# Patient Record
Sex: Female | Born: 1975
Health system: Southern US, Community
[De-identification: ages and names within clinical notes are randomized; demographics above are authoritative.]

## PROBLEM LIST (undated history)

## (undated) DIAGNOSIS — J45909 Unspecified asthma, uncomplicated: Secondary | ICD-10-CM

## (undated) DIAGNOSIS — I10 Essential (primary) hypertension: Secondary | ICD-10-CM

## (undated) HISTORY — PX: ABDOMINAL HYSTERECTOMY: SHX81

---

## 2005-10-04 ENCOUNTER — Emergency Department: Payer: Self-pay | Admitting: Emergency Medicine

## 2005-10-14 ENCOUNTER — Emergency Department: Payer: Self-pay | Admitting: General Practice

## 2006-06-30 ENCOUNTER — Emergency Department: Payer: Self-pay | Admitting: Emergency Medicine

## 2006-08-02 ENCOUNTER — Emergency Department: Payer: Self-pay | Admitting: Internal Medicine

## 2006-09-26 ENCOUNTER — Emergency Department: Payer: Self-pay | Admitting: Emergency Medicine

## 2007-01-02 ENCOUNTER — Ambulatory Visit: Payer: Self-pay | Admitting: Obstetrics & Gynecology

## 2007-01-03 ENCOUNTER — Ambulatory Visit: Payer: Self-pay | Admitting: Obstetrics & Gynecology

## 2007-06-15 ENCOUNTER — Emergency Department: Payer: Self-pay | Admitting: Internal Medicine

## 2007-12-17 ENCOUNTER — Emergency Department: Payer: Self-pay | Admitting: Emergency Medicine

## 2007-12-25 ENCOUNTER — Emergency Department: Payer: Self-pay | Admitting: Emergency Medicine

## 2008-01-23 ENCOUNTER — Emergency Department: Payer: Self-pay | Admitting: Emergency Medicine

## 2008-01-26 ENCOUNTER — Emergency Department: Payer: Self-pay | Admitting: Emergency Medicine

## 2008-01-27 ENCOUNTER — Emergency Department (HOSPITAL_COMMUNITY): Admission: EM | Admit: 2008-01-27 | Discharge: 2008-01-27 | Payer: Self-pay | Admitting: Emergency Medicine

## 2008-06-21 ENCOUNTER — Emergency Department: Payer: Self-pay | Admitting: Emergency Medicine

## 2008-06-22 ENCOUNTER — Emergency Department: Payer: Self-pay | Admitting: Emergency Medicine

## 2008-10-17 ENCOUNTER — Emergency Department: Payer: Self-pay | Admitting: Emergency Medicine

## 2009-03-06 ENCOUNTER — Ambulatory Visit: Payer: Self-pay | Admitting: Internal Medicine

## 2009-03-11 ENCOUNTER — Ambulatory Visit: Payer: Self-pay | Admitting: Internal Medicine

## 2011-02-19 ENCOUNTER — Emergency Department: Payer: Self-pay | Admitting: Internal Medicine

## 2011-02-27 ENCOUNTER — Emergency Department: Payer: Self-pay | Admitting: Emergency Medicine

## 2011-11-07 ENCOUNTER — Emergency Department: Payer: Self-pay | Admitting: Emergency Medicine

## 2012-07-20 ENCOUNTER — Ambulatory Visit: Payer: Self-pay | Admitting: Urology

## 2012-09-03 ENCOUNTER — Emergency Department: Payer: Self-pay | Admitting: Emergency Medicine

## 2012-09-03 LAB — COMPREHENSIVE METABOLIC PANEL
Albumin: 3.4 g/dL (ref 3.4–5.0)
Alkaline Phosphatase: 92 U/L (ref 50–136)
Anion Gap: 6 — ABNORMAL LOW (ref 7–16)
BUN: 11 mg/dL (ref 7–18)
Calcium, Total: 8.4 mg/dL — ABNORMAL LOW (ref 8.5–10.1)
Co2: 23 mmol/L (ref 21–32)
Glucose: 74 mg/dL (ref 65–99)
Potassium: 3.6 mmol/L (ref 3.5–5.1)
SGOT(AST): 25 U/L (ref 15–37)
SGPT (ALT): 19 U/L (ref 12–78)
Total Protein: 7.8 g/dL (ref 6.4–8.2)

## 2012-09-03 LAB — APTT: Activated PTT: 29.5 secs (ref 23.6–35.9)

## 2012-09-03 LAB — CBC
HCT: 41.3 % (ref 35.0–47.0)
HGB: 13.8 g/dL (ref 12.0–16.0)
RBC: 4.67 10*6/uL (ref 3.80–5.20)
WBC: 4.7 10*3/uL (ref 3.6–11.0)

## 2012-09-03 LAB — PROTIME-INR
INR: 0.9
Prothrombin Time: 12 secs (ref 11.5–14.7)

## 2012-09-03 LAB — TROPONIN I: Troponin-I: <0.02 ng/mL

## 2012-12-25 ENCOUNTER — Emergency Department: Payer: Self-pay | Admitting: Emergency Medicine

## 2012-12-25 LAB — URINALYSIS, COMPLETE
Bacteria: NONE SEEN
Bilirubin,UR: NEGATIVE
Glucose,UR: NEGATIVE mg/dL (ref 0–75)
Leukocyte Esterase: NEGATIVE
Nitrite: NEGATIVE
Ph: 6 (ref 4.5–8.0)
Protein: NEGATIVE
RBC,UR: 7 /HPF (ref 0–5)
Squamous Epithelial: 3

## 2012-12-25 LAB — WET PREP, GENITAL

## 2013-03-20 ENCOUNTER — Emergency Department: Payer: Self-pay | Admitting: Emergency Medicine

## 2013-03-20 LAB — COMPREHENSIVE METABOLIC PANEL
Alkaline Phosphatase: 96 U/L (ref 50–136)
Anion Gap: 6 — ABNORMAL LOW (ref 7–16)
BUN: 11 mg/dL (ref 7–18)
Chloride: 109 mmol/L — ABNORMAL HIGH (ref 98–107)
Co2: 26 mmol/L (ref 21–32)
Creatinine: 0.85 mg/dL (ref 0.60–1.30)
EGFR (Non-African Amer.): >60
Glucose: 82 mg/dL (ref 65–99)
Osmolality: 280 (ref 275–301)
Potassium: 3.5 mmol/L (ref 3.5–5.1)
SGOT(AST): 20 U/L (ref 15–37)
Sodium: 141 mmol/L (ref 136–145)

## 2013-03-20 LAB — TROPONIN I: Troponin-I: <0.02 ng/mL

## 2013-03-20 LAB — CBC
HGB: 13.2 g/dL (ref 12.0–16.0)
MCV: 87 fL (ref 80–100)
RBC: 4.58 10*6/uL (ref 3.80–5.20)
WBC: 4.5 10*3/uL (ref 3.6–11.0)

## 2013-12-17 ENCOUNTER — Ambulatory Visit: Payer: Self-pay | Admitting: Family Medicine

## 2013-12-18 ENCOUNTER — Emergency Department (HOSPITAL_COMMUNITY)
Admission: EM | Admit: 2013-12-18 | Discharge: 2013-12-19 | Discharge status: Against Medical Advice | Payer: No Typology Code available for payment source | Attending: Emergency Medicine | Admitting: Emergency Medicine

## 2013-12-18 ENCOUNTER — Encounter (HOSPITAL_COMMUNITY): Payer: Self-pay | Admitting: Emergency Medicine

## 2013-12-18 ENCOUNTER — Emergency Department: Payer: Self-pay | Admitting: Emergency Medicine

## 2013-12-18 DIAGNOSIS — R51 Headache: Secondary | ICD-10-CM | POA: Insufficient documentation

## 2013-12-18 DIAGNOSIS — J45909 Unspecified asthma, uncomplicated: Secondary | ICD-10-CM | POA: Insufficient documentation

## 2013-12-18 DIAGNOSIS — I10 Essential (primary) hypertension: Secondary | ICD-10-CM | POA: Insufficient documentation

## 2013-12-18 DIAGNOSIS — R209 Unspecified disturbances of skin sensation: Secondary | ICD-10-CM | POA: Insufficient documentation

## 2013-12-18 HISTORY — DX: Essential (primary) hypertension: I10

## 2013-12-18 HISTORY — DX: Unspecified asthma, uncomplicated: J45.909

## 2013-12-18 LAB — BASIC METABOLIC PANEL
Anion Gap: 6 — ABNORMAL LOW (ref 7–16)
BUN: 11 mg/dL (ref 7–18)
CHLORIDE: 102 mmol/L (ref 98–107)
Calcium, Total: 8.4 mg/dL — ABNORMAL LOW (ref 8.5–10.1)
Co2: 28 mmol/L (ref 21–32)
Creatinine: 0.99 mg/dL (ref 0.60–1.30)
EGFR (African American): >60
Glucose: 103 mg/dL — ABNORMAL HIGH (ref 65–99)
Osmolality: 272 (ref 275–301)
Potassium: 3.6 mmol/L (ref 3.5–5.1)
Sodium: 136 mmol/L (ref 136–145)

## 2013-12-18 LAB — CBC
HCT: 41.2 % (ref 35.0–47.0)
HGB: 13.3 g/dL (ref 12.0–16.0)
MCH: 28.5 pg (ref 26.0–34.0)
MCHC: 32.2 g/dL (ref 32.0–36.0)
MCV: 88 fL (ref 80–100)
PLATELETS: 250 10*3/uL (ref 150–440)
RBC: 4.66 10*6/uL (ref 3.80–5.20)
RDW: 13.8 % (ref 11.5–14.5)
WBC: 6.7 10*3/uL (ref 3.6–11.0)

## 2013-12-18 LAB — TROPONIN I: Troponin-I: <0.02 ng/mL

## 2013-12-18 NOTE — ED Notes (Signed)
Pt. reports headache with intermittent tongue numbness onset today , speech clear / no facial asymmetry / no arm drift , equal grips / alert and oriented . Ambulatory / respirations unlabored .

## 2013-12-19 NOTE — ED Notes (Signed)
Pt was called to go back to a room but no answer. Moved to OTF.

## 2018-03-20 ENCOUNTER — Other Ambulatory Visit: Payer: Self-pay | Admitting: Internal Medicine

## 2018-03-20 DIAGNOSIS — Z1231 Encounter for screening mammogram for malignant neoplasm of breast: Secondary | ICD-10-CM

## 2018-04-06 ENCOUNTER — Ambulatory Visit: Payer: BLUE CROSS/BLUE SHIELD | Attending: Internal Medicine

## 2018-11-19 ENCOUNTER — Other Ambulatory Visit: Payer: Self-pay

## 2018-11-19 ENCOUNTER — Emergency Department
Admission: EM | Admit: 2018-11-19 | Discharge: 2018-11-19 | Disposition: A | Discharge status: Home / Self Care | Payer: Managed Care, Other (non HMO) | Attending: Emergency Medicine | Admitting: Emergency Medicine

## 2018-11-19 ENCOUNTER — Emergency Department: Payer: Managed Care, Other (non HMO)

## 2018-11-19 DIAGNOSIS — R142 Eructation: Secondary | ICD-10-CM | POA: Diagnosis not present

## 2018-11-19 DIAGNOSIS — M25471 Effusion, right ankle: Secondary | ICD-10-CM | POA: Insufficient documentation

## 2018-11-19 DIAGNOSIS — J45909 Unspecified asthma, uncomplicated: Secondary | ICD-10-CM | POA: Diagnosis not present

## 2018-11-19 DIAGNOSIS — R079 Chest pain, unspecified: Secondary | ICD-10-CM | POA: Insufficient documentation

## 2018-11-19 DIAGNOSIS — I10 Essential (primary) hypertension: Secondary | ICD-10-CM | POA: Diagnosis not present

## 2018-11-19 LAB — CBC
HCT: 42.5 % (ref 36.0–46.0)
HEMOGLOBIN: 13.7 g/dL (ref 12.0–15.0)
MCH: 28.2 pg (ref 26.0–34.0)
MCHC: 32.2 g/dL (ref 30.0–36.0)
MCV: 87.6 fL (ref 80.0–100.0)
Platelets: 283 10*3/uL (ref 150–400)
RBC: 4.85 MIL/uL (ref 3.87–5.11)
RDW: 13.5 % (ref 11.5–15.5)
WBC: 4 10*3/uL (ref 4.0–10.5)
nRBC: 0 % (ref 0.0–0.2)

## 2018-11-19 LAB — BASIC METABOLIC PANEL
Anion gap: 10 (ref 5–15)
BUN: 11 mg/dL (ref 6–20)
CO2: 23 mmol/L (ref 22–32)
Calcium: 8.3 mg/dL — ABNORMAL LOW (ref 8.9–10.3)
Chloride: 106 mmol/L (ref 98–111)
Creatinine, Ser: 0.86 mg/dL (ref 0.44–1.00)
GFR calc Af Amer: >60 mL/min (ref >60)
Glucose, Bld: 115 mg/dL — ABNORMAL HIGH (ref 70–99)
Potassium: 3.3 mmol/L — ABNORMAL LOW (ref 3.5–5.1)
Sodium: 139 mmol/L (ref 135–145)

## 2018-11-19 LAB — TROPONIN I
Troponin I: <0.03 ng/mL (ref <0.03)
Troponin I: <0.03 ng/mL (ref <0.03)

## 2018-11-19 LAB — FIBRIN DERIVATIVES D-DIMER (ARMC ONLY): Fibrin derivatives D-dimer (ARMC): 436.47 ng/mL (FEU) (ref 0.00–499.00)

## 2018-11-19 MED ORDER — ASPIRIN 81 MG PO CHEW
324.0000 mg | CHEWABLE_TABLET | Freq: Once | ORAL | Status: AC
  Administered 2018-11-19: 324 mg via ORAL
  Filled 2018-11-19: qty 4

## 2018-11-19 MED ORDER — SODIUM CHLORIDE 0.9% FLUSH: 3.0000 mL | Freq: Once | INTRAVENOUS | Status: DC

## 2018-11-19 MED ORDER — ALUM & MAG HYDROXIDE-SIMETH 200-200-20 MG/5ML PO SUSP
30.0000 mL | Freq: Once | ORAL | Status: AC
  Administered 2018-11-19: 30 mL via ORAL
  Filled 2018-11-19: qty 30

## 2018-11-19 NOTE — ED Provider Notes (Addendum)
[facility] Emergency Department Provider Note  ____________________________________________  Time seen: Approximately 2:03 PM  I have reviewed the triage vital signs and the nursing notes.   HISTORY  Chief Complaint Chest Pain    HPI Sydney Harvey is a 43 y.o. female with a history of HTN, taken off of her antihypertensives in January, remote history of reassuring stress test, presenting with chest pain.  The patient reports that for the past several days, she has had a central chest pain which now has moved to under the left breast.  It comes and goes, and can occur at rest.  It improves if she lifts her left arm above her head or if she takes deep breaths.  She does not associate it to position, food.  She does report improvement with burping, but then it "builds back up."  She has not had any associated diaphoresis, shortness of breath, lightheadedness or syncope, palpitations.  She has noted intermittent left ankle swelling, currently improved at this time, without any calf pain.  Past Medical History:  Diagnosis Date  . Asthma   . Hypertension     There are no active problems to display for this patient.   Past Surgical History:  Procedure Laterality Date  . ABDOMINAL HYSTERECTOMY        Allergies Patient has no known allergies.  No family history on file.  Social History Social History   Tobacco Use  . Smoking status: Never Smoker  . Smokeless tobacco: Never Used  Substance Use Topics  . Alcohol use: Yes    Comment: occassionally  . Drug use: No    Review of Systems Constitutional: No fever/chills.  No lightheadedness or syncope.  No diaphoresis. Eyes: No visual changes. ENT: No sore throat. No congestion or rhinorrhea. Cardiovascular: As of left-sided chest pain. Denies palpitations. Respiratory: Denies shortness of breath.  No cough. Gastrointestinal: No abdominal pain.  No nausea, no vomiting.  No diarrhea.  No  constipation. Genitourinary: Negative for dysuria. Musculoskeletal: Negative for back pain.  + intermittent swelling of the left ankle.  No calf pain. Skin: Negative for rash. Neurological: Negative for headaches. No focal numbness, tingling or weakness.     ____________________________________________   PHYSICAL EXAM:  VITAL SIGNS: ED Triage Vitals  Enc Vitals Group     BP 11/19/18 1127 (!) 155/104     Pulse Rate 11/19/18 1127 63     Resp 11/19/18 1127 18     Temp 11/19/18 1127 98.2 F (36.8 C)     Temp Source 11/19/18 1127 Oral     SpO2 11/19/18 1127 100 %     Weight 11/19/18 1128 258 lb (117 kg)     Height 11/19/18 1128 5\' 11"  (1.803 m)     Head Circumference --      Peak Flow --      Pain Score 11/19/18 1127 0     Pain Loc --      Pain Edu? --      Excl. in GC? --     Constitutional: Alert and oriented.  Answers questions appropriately. Eyes: Conjunctivae are normal.  EOMI. No scleral icterus. Head: Atraumatic. Nose: No congestion/rhinnorhea. Mouth/Throat: Mucous membranes are moist.  Neck: No stridor.  Supple.  No JVD. Cardiovascular: Normal rate, regular rhythm. No murmurs, rubs or gallops.  Respiratory: Normal respiratory effort.  No accessory muscle use or retractions. Lungs CTAB.  No wheezes, rales or ronchi. Gastrointestinal: Soft, nontender and nondistended.  No guarding or rebound.  No  peritoneal signs. Musculoskeletal: No LE edema.  She has a photo of left ankle swelling, but no edema on my examination.  No ttp in the calves or palpable cords.  Negative Homan's sign. Neurologic:  A&Ox3.  Speech is clear.  Face and smile are symmetric.  EOMI.  Moves all extremities well. Skin:  Skin is warm, dry and intact. No rash noted. Psychiatric: Mood and affect are normal. Speech and behavior are normal.  Normal judgement  ____________________________________________   LABS (all labs ordered are listed, but only abnormal results are displayed)  Labs Reviewed   BASIC METABOLIC PANEL - Abnormal; Notable for the following components:      Result Value   Potassium 3.3 (*)    Glucose, Bld 115 (*)    Calcium 8.3 (*)    All other components within normal limits  CBC  TROPONIN I  FIBRIN DERIVATIVES D-DIMER ([facility] ONLY)  TROPONIN I   ____________________________________________  EKG  ED ECG REPORT I, Anne-Caroline Sharma Covert, the attending physician, personally viewed and interpreted this ECG.   Date: 11/19/2018  EKG Time: 1123  Rate: 62  Rhythm: normal sinus rhythm  Axis: leftward  Intervals:none  ST&T Change: no SETMI; nonspecific T wave inversion in V1.  ____________________________________________  RADIOLOGY  Dg Chest 2 View  Result Date: 11/19/2018 CLINICAL DATA:  Left-sided chest pain, shortness of breath, and nausea beginning 4 days ago. EXAM: CHEST - 2 VIEW COMPARISON:  12/18/2013 FINDINGS: The heart size and mediastinal contours are within normal limits. Both lungs are clear. The visualized skeletal structures are unremarkable. IMPRESSION: Negative.  No active cardiopulmonary disease. Electronically Signed   By: Myles Rosenthal M.D.   On: 11/19/2018 12:40    ____________________________________________   PROCEDURES  Procedure(s) performed: None  Procedures  Critical Care performed: No ____________________________________________   INITIAL IMPRESSION / ASSESSMENT AND PLAN / ED COURSE  Pertinent labs & imaging results that were available during my care of the patient were reviewed by me and considered in my medical decision making (see chart for details).  43 y.o. female with a history of hypertension, currently on treated, presenting with chest pain.  Overall, the patient does have hypertension here, which is a risk factor.  She has not had any recent risk stratification studies.  Her chest pain is atypical, not worse with exertion, without any associated red flag symptoms.  The fact that it improves with burping may point added  GI cause, and I will treat her with a GI cocktail here, and give her instructions for a bland diet.  The patient's work-up in the emergency department has been reassuring.  Her EKG does not show any ischemic changes and her troponin is negative.  A second troponin is pending.  The patient's chest x-ray does not show any acute cardiopulmonary abnormalities.  The patient would like to follow-up with Dr. Ursula Beath, she sees his wife for her primary care.  Given the intermittent ankle swelling, will get a d-dimer for evaluation of possible PE; however, the patient is not hypoxic, tachycardic, she is not a smoker and does not take OCPs, and she has no family or personal history of blood clots.  She has no evidence of DVT on my exam.  Plan reevaluation for final disposition.  ----------------------------------------- 4:03 PM on 11/19/2018 -----------------------------------------  The patient's d-dimer is within normal limits and her second troponin is negative.  She states that the GI cocktail did not help her symptoms, so we will give her aspirin.  At this time,  the patient is safe for discharge home.  There is no evidence of ACS or MI, or any other life-threatening cause for her chest pain.  She will follow-up with her primary care physician tomorrow, and make an appointment with a cardiologist for stress test as an outpatient.  I gave her return precautions, as well as follow-up instructions. ____________________________________________  FINAL CLINICAL IMPRESSION(S) / ED DIAGNOSES  Final diagnoses:  Chest pain, unspecified type  Burping  Essential hypertension  Right ankle swelling         NEW MEDICATIONS STARTED DURING THIS VISIT:  New Prescriptions   No medications on file      Rockne MenghiniNorman, Anne-Caroline, MD 11/19/18 1411    Rockne MenghiniNorman, Anne-Caroline, MD 11/19/18 1412    Rockne MenghiniNorman, Anne-Caroline, MD 11/19/18 [phone]

## 2018-11-19 NOTE — ED Triage Notes (Signed)
Pt c/o left sided achy/sharp chest pain since Wednesday with SOB and nausea. Denies diaphoresis, no SOB noted at this time. Pt is in NAD.

## 2018-11-19 NOTE — Discharge Instructions (Addendum)
Please take your blood pressure every day during a calm part of your day, and bring the record with you to your primary care physician's office.  Your doctor will be able to decide if you need to be back on blood pressure medications.  Please eat a bland diet.  Keep a journal with your chest pain symptoms, and show them to your primary care physician.  Return to the emergency department if you develop severe pain, lightheadedness or fainting, palpitations, shortness of breath, fever, inability to keep down fluids, or any other symptoms concerning to you.

## 2018-11-19 NOTE — ED Notes (Signed)
Topaz pad not working. Pt signed physical form and was agreeable with discharge instructions and follow ups.

## 2020-12-01 ENCOUNTER — Emergency Department
Admission: EM | Admit: 2020-12-01 | Discharge: 2020-12-01 | Disposition: A | Discharge status: Home / Self Care | Payer: Managed Care, Other (non HMO) | Attending: Emergency Medicine | Admitting: Emergency Medicine

## 2020-12-01 ENCOUNTER — Emergency Department: Payer: Managed Care, Other (non HMO)

## 2020-12-01 ENCOUNTER — Other Ambulatory Visit: Payer: Self-pay

## 2020-12-01 DIAGNOSIS — R079 Chest pain, unspecified: Secondary | ICD-10-CM | POA: Insufficient documentation

## 2020-12-01 DIAGNOSIS — J45909 Unspecified asthma, uncomplicated: Secondary | ICD-10-CM | POA: Diagnosis not present

## 2020-12-01 DIAGNOSIS — I10 Essential (primary) hypertension: Secondary | ICD-10-CM | POA: Diagnosis not present

## 2020-12-01 DIAGNOSIS — Z79899 Other long term (current) drug therapy: Secondary | ICD-10-CM | POA: Diagnosis not present

## 2020-12-01 LAB — BASIC METABOLIC PANEL
Anion gap: 9 (ref 5–15)
BUN: 10 mg/dL (ref 6–20)
CO2: 22 mmol/L (ref 22–32)
Calcium: 8.8 mg/dL — ABNORMAL LOW (ref 8.9–10.3)
Chloride: 105 mmol/L (ref 98–111)
Creatinine, Ser: 0.93 mg/dL (ref 0.44–1.00)
GFR, Estimated: >60 mL/min (ref >60)
Glucose, Bld: 101 mg/dL — ABNORMAL HIGH (ref 70–99)
Potassium: 3.1 mmol/L — ABNORMAL LOW (ref 3.5–5.1)
Sodium: 136 mmol/L (ref 135–145)

## 2020-12-01 LAB — CBC
HCT: 38.4 % (ref 36.0–46.0)
Hemoglobin: 12.6 g/dL (ref 12.0–15.0)
MCH: 27.3 pg (ref 26.0–34.0)
MCHC: 32.8 g/dL (ref 30.0–36.0)
MCV: 83.3 fL (ref 80.0–100.0)
Platelets: 279 10*3/uL (ref 150–400)
RBC: 4.61 MIL/uL (ref 3.87–5.11)
RDW: 14.8 % (ref 11.5–15.5)
WBC: 4.2 10*3/uL (ref 4.0–10.5)
nRBC: 0 % (ref 0.0–0.2)

## 2020-12-01 LAB — TROPONIN I (HIGH SENSITIVITY)
Troponin I (High Sensitivity): 3 ng/L (ref <18)
Troponin I (High Sensitivity): 3 ng/L (ref <18)

## 2020-12-01 MED ORDER — ACETAMINOPHEN 500 MG PO TABS
1000.0000 mg | ORAL_TABLET | Freq: Once | ORAL | Status: AC
  Administered 2020-12-01: 1000 mg via ORAL
  Filled 2020-12-01: qty 2

## 2020-12-01 MED ORDER — POTASSIUM CHLORIDE CRYS ER 20 MEQ PO TBCR
40.0000 meq | EXTENDED_RELEASE_TABLET | Freq: Once | ORAL | Status: AC
  Administered 2020-12-01: 40 meq via ORAL
  Filled 2020-12-01: qty 2

## 2020-12-01 MED ORDER — IOHEXOL 350 MG/ML SOLN
75.0000 mL | Freq: Once | INTRAVENOUS | Status: AC | PRN
  Administered 2020-12-01: 75 mL via INTRAVENOUS

## 2020-12-01 NOTE — ED Provider Notes (Signed)
[facility] Emergency Department Provider Note  ____________________________________________   Event Date/Time   First MD Initiated Contact with Patient 12/01/20 1929     (approximate)  I have reviewed the triage vital signs and the nursing notes.   HISTORY  Chief Complaint Chest Pain    HPI Sydney Harvey is a 45 y.o. female with recent bariatric surgery who comes in for chest pain.  Patient reports having chest pain that started yesterday.  Occasionally the pain radiates to her back and down her left arm.  Patient states that the pain is intermittent, last first few seconds, sharp and stabbing given has little bit of a pressure sensation.  Has not taken anything to help the pain.  The pain is on her upper left chest.  She denies feeling short of breath unless she is doing a bunch of things when she feels that it is hard to catch her breath as well.  Patient reports that she is on Eliquis status post her surgery for prophylaxis and has been compliant with it.  She denies any issues with her bariatric surgery or abdominal pain.  Patient was told by her bariatric doctor to come in to make sure she was not having a pulmonary embolism          Past Medical History:  Diagnosis Date  . Asthma   . Hypertension     There are no problems to display for this patient.   Past Surgical History:  Procedure Laterality Date  . ABDOMINAL HYSTERECTOMY      Prior to Admission medications   Medication Sig Start Date End Date Taking? Authorizing Provider  albuterol (PROVENTIL HFA;VENTOLIN HFA) 108 (90 Base) MCG/ACT inhaler Inhale 2 puffs into the lungs every 4 (four) hours as needed for wheezing.    [provider]  D3-50 1.25 MG (50000 UT) capsule Take 50,000 Units by mouth once a week. 11/12/18   [provider]  gabapentin (NEURONTIN) 100 MG capsule Take 100 mg by mouth daily. 11/12/18   [provider]  hydrochlorothiazide (HYDRODIURIL)  12.5 MG tablet Take 12.5 mg by mouth daily.    [provider]  losartan (COZAAR) 100 MG tablet Take 100 mg by mouth daily.    [provider]  meloxicam (MOBIC) 15 MG tablet Take 15 mg by mouth daily. 11/12/18   [provider]  Phendimetrazine Tartrate 105 MG CP24 Take 105 mg by mouth daily.    [provider]    Allergies Patient has no known allergies.  No family history on file.  Social History Social History   Tobacco Use  . Smoking status: Never Smoker  . Smokeless tobacco: Never Used  Substance Use Topics  . Alcohol use: Yes    Comment: occassionally  . Drug use: No      Review of Systems Constitutional: No fever/chills Eyes: No visual changes. ENT: No sore throat. Cardiovascular: Positive chest pain Respiratory: Positive shortness of breath Gastrointestinal: No abdominal pain.  No nausea, no vomiting.  No diarrhea.  No constipation. Genitourinary: Negative for dysuria. Musculoskeletal: Negative for back pain. Skin: Negative for rash. Neurological: Negative for headaches, focal weakness or numbness. All other ROS negative ____________________________________________   PHYSICAL EXAM:  VITAL SIGNS: ED Triage Vitals  Enc Vitals Group     BP 12/01/20 1749 (!) 144/80     Pulse Rate 12/01/20 1904 82     Resp 12/01/20 1749 18     Temp 12/01/20 1749 98.5 F (36.9 C)  Temp src --      SpO2 12/01/20 1749 100 %     Weight 12/01/20 1747 262 lb (118.8 kg)     Height 12/01/20 1747 5\' 10"  (1.778 m)     Head Circumference --      Peak Flow --      Pain Score 12/01/20 1747 4     Pain Loc --      Pain Edu? --      Excl. in GC? --     Constitutional: Alert and oriented. Well appearing and in no acute distress. Eyes: Conjunctivae are normal. EOMI. Head: Atraumatic. Nose: No congestion/rhinnorhea. Mouth/Throat: Mucous membranes are moist.   Neck: No stridor. Trachea Midline. FROM Cardiovascular: Normal rate, regular rhythm.  Grossly normal heart sounds.  Good peripheral circulation.  Some mild chest wall tenderness the upper left chest wall equal pulses in her feet and hands Respiratory: Normal respiratory effort.  No retractions. Lungs CTAB. Gastrointestinal: Soft and nontender. No distention. No abdominal bruits.  Musculoskeletal: No lower extremity tenderness nor edema.  No joint effusions. Neurologic:  Normal speech and language. No gross focal neurologic deficits are appreciated.  Skin:  Skin is warm, dry and intact. No rash noted. Psychiatric: Mood and affect are normal. Speech and behavior are normal. GU: Deferred   ____________________________________________   LABS (all labs ordered are listed, but only abnormal results are displayed)  Labs Reviewed  BASIC METABOLIC PANEL - Abnormal; Notable for the following components:      Result Value   Potassium 3.1 (*)    Glucose, Bld 101 (*)    Calcium 8.8 (*)    All other components within normal limits  CBC  TROPONIN I (HIGH SENSITIVITY)  TROPONIN I (HIGH SENSITIVITY)   ____________________________________________   ED ECG REPORT I, 12/03/20, the attending physician, personally viewed and interpreted this ECG.  Normal sinus rate of 84, no ST elevation, T wave inversion in lead III, normal intervals ____________________________________________  RADIOLOGY Concha Se, personally viewed and evaluated these images (plain radiographs) as part of my medical decision making, as well as reviewing the written report by the radiologist.  ED MD interpretation: No pneumonia  Official radiology report(s): DG Chest 2 View  Result Date: 12/01/2020 CLINICAL DATA:  Chest pain EXAM: CHEST - 2 VIEW COMPARISON:  11/19/2018 FINDINGS: The heart size and mediastinal contours are within normal limits. Low lung volumes. No consolidation or edema. No pleural effusion or pneumothorax. The visualized skeletal structures are unremarkable. IMPRESSION: No acute  process in the chest. Electronically Signed   By: 01/19/2019 M.D.   On: 12/01/2020 18:13    ____________________________________________   PROCEDURES  Procedure(s) performed (including Critical Care):  .1-3 Lead EKG Interpretation Performed by: 12/03/2020, MD Authorized by: Concha Se, MD     Interpretation: normal     ECG rate:  80s   ECG rate assessment: normal     Rhythm: sinus rhythm     Ectopy: none     Conduction: normal       ____________________________________________   INITIAL IMPRESSION / ASSESSMENT AND PLAN / ED COURSE   Nygeria Baldo was evaluated in Emergency Department on 12/01/2020 for the symptoms described in the history of present illness. She was evaluated in the context of the global COVID-19 pandemic, which necessitated consideration that the patient might be at risk for infection with the SARS-CoV-2 virus that causes COVID-19. Institutional protocols and algorithms that pertain to the evaluation of patients  at risk for COVID-19 are in a state of rapid change based on information released by regulatory bodies including the CDC and federal and state organizations. These policies and algorithms were followed during the patient's care in the ED.    Most Likely DDx:  -MSK (atypical chest pain) somewhat reproducible but given recent bariatric surgery will get CT PE to evaluate for pulmonary embolism.  Also considered the possibility of dissection given radiation into her back which she got equal pulses bilaterally and only slightly hypertensive.  The CT PE should evaluate the aortic knob to evaluate for dissection and she has no abdominal tenderness to suggest issue with her bariatric surgery.  Given the history concern for chest pain we will keep patient on the cardiac monitor to evaluate for arrhythmia  DDx that was also considered d/t potential to cause harm, but was found less likely based on history and physical (as detailed above): -PNA (no fevers,  cough but CXR to evaluate) -PNX (reassured with equal b/l breath sounds, CXR to evaluate) -Symptomatic anemia (will get H&H) -Pericarditis no rub on exam, EKG changes or hx to suggest dx -Tamponade (no notable SOB, tachycardic, hypotensive) -Esophageal rupture (no h/o diffuse vomitting/no crepitus)  Labs are reassuring.  Potassium slightly low at 3.1 we will give some oral repletion  Patient's troponin is negative and her symptoms have been going on for greater than 3 hours and she is a low heart score therefore do not need repeat troponin.  CT PE was negative and I did discuss with the cardiologist Dr. Allena Katz and there was no evidence of dissection from what he can see.  Reevaluated patient and she has no chest pain at this time.  I did tell her about the incidental finding of the sludge in the gallbladder.  At this time patient is doing well and feels comfortable with discharge home but we discussed that if her symptoms are worsening she should return to the ER for repeat evaluation       ____________________________________________   FINAL CLINICAL IMPRESSION(S) / ED DIAGNOSES   Final diagnoses:  None     MEDICATIONS GIVEN DURING THIS VISIT:  Medications - No data to display   ED Discharge Orders    None       Note:  This document was prepared using Dragon voice recognition software and may include unintentional dictation errors.   Concha Se, MD 12/01/20 [phone]

## 2020-12-01 NOTE — Discharge Instructions (Signed)
Labs only showed slightly low potassium.  Otherwise no signs of a heart attack and CT as below.  Return the ER if develop worsening pain, shortness of breath otherwise take Tylenol 1 g every 8 hours to help with discomfort  IMPRESSION: No evidence of acute pulmonary embolism or other acute abnormality.   Probable sludge in the gallbladder.

## 2020-12-01 NOTE — ED Triage Notes (Signed)
Pt comes with c/o left sided CP that radiated in back and to left arm the patient states recent weightloss surgery month back. Pt states this started yesterday. Pt states some SOB.

## 2022-08-26 ENCOUNTER — Other Ambulatory Visit: Payer: Self-pay | Admitting: Family

## 2022-08-26 DIAGNOSIS — Z1231 Encounter for screening mammogram for malignant neoplasm of breast: Secondary | ICD-10-CM

## 2022-11-08 IMAGING — CT CT ANGIO CHEST
2 of 6 series · 18 of 46 positions shown · IV contrast (APPLIED)
Comparison: None.

CLINICAL DATA: PE suspected.  EMR note states left-sided chest pain

EXAM:
CT ANGIOGRAPHY CHEST WITH CONTRAST
TECHNIQUE: Multidetector CT imaging of the chest was performed using the
standard protocol during bolus administration of intravenous
contrast. Multiplanar CT image reconstructions and MIPs were
obtained to evaluate the vascular anatomy.
CONTRAST:  75mL OMNIPAQUE IOHEXOL 350 MG/ML SOLN

[Series 5: thins · axial · 0.70mm/px · z∈[-290,-71]mm · 16 of 241 slices shown]
[im 11/241  lung]
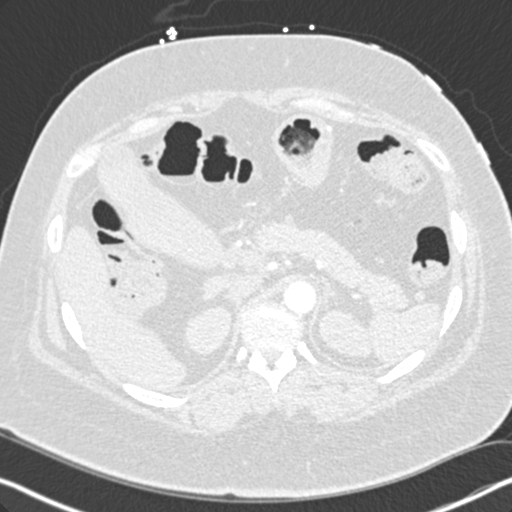
[im 32/241  soft-tissue]
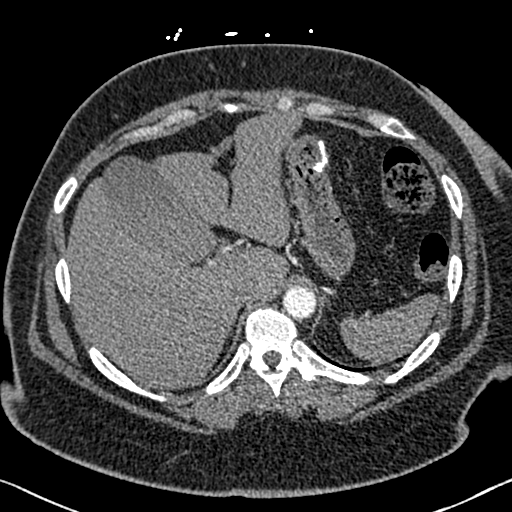
[im 42/241  lung]
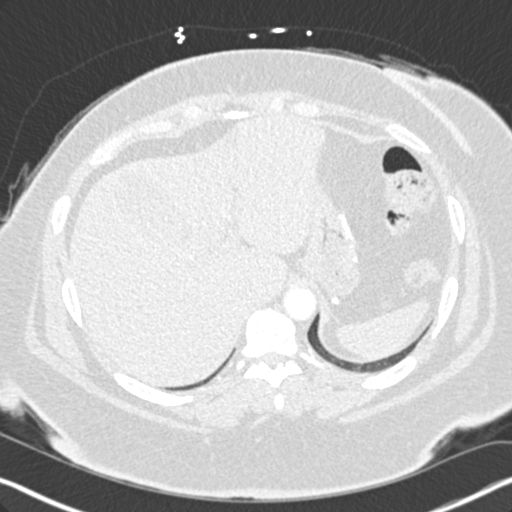
[im 53/241  soft-tissue]
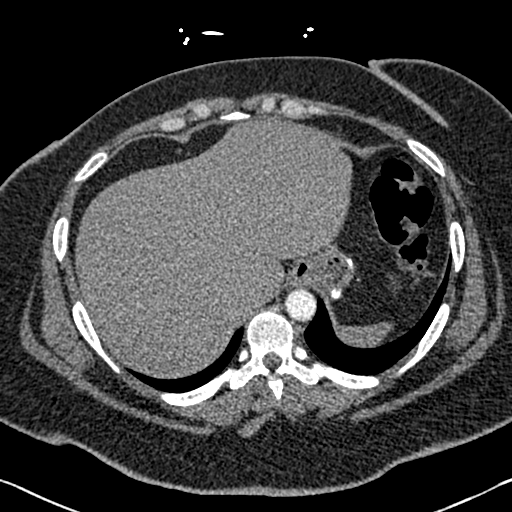
[im 74/241  lung]
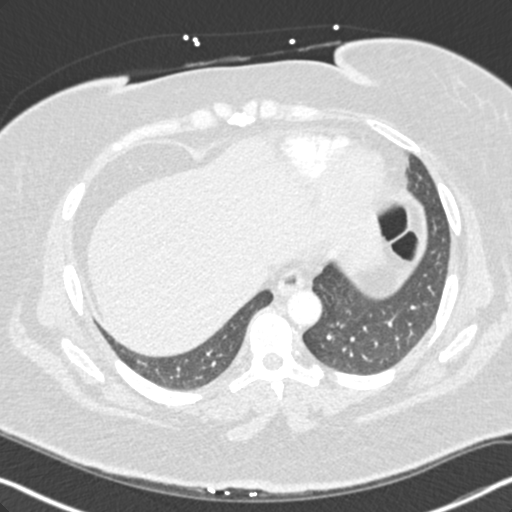
[im 84/241  soft-tissue]
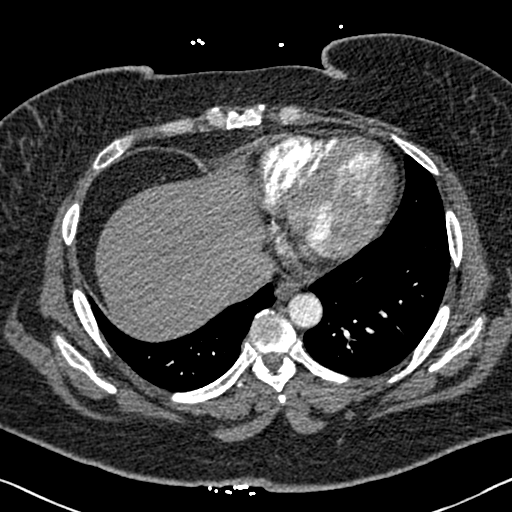
[im 94/241  lung]
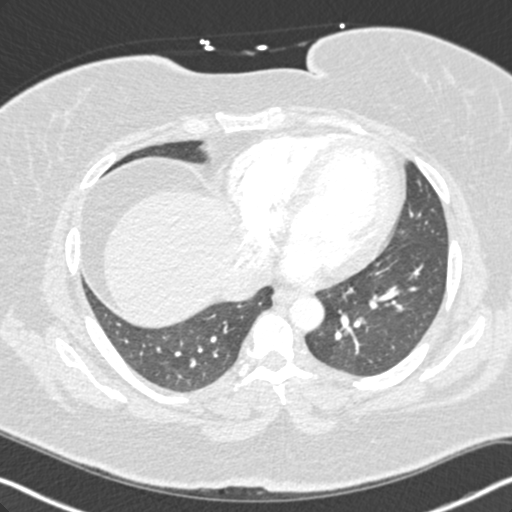
[im 115/241  soft-tissue]
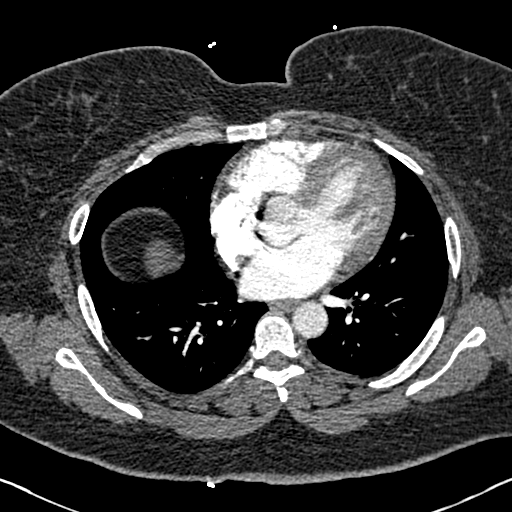
[im 126/241  lung]
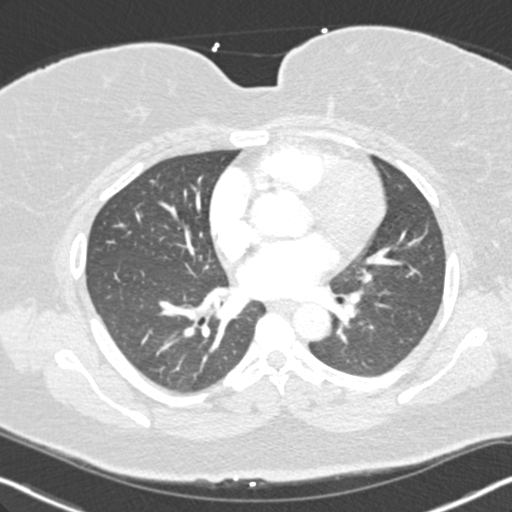
[im 147/241  soft-tissue]
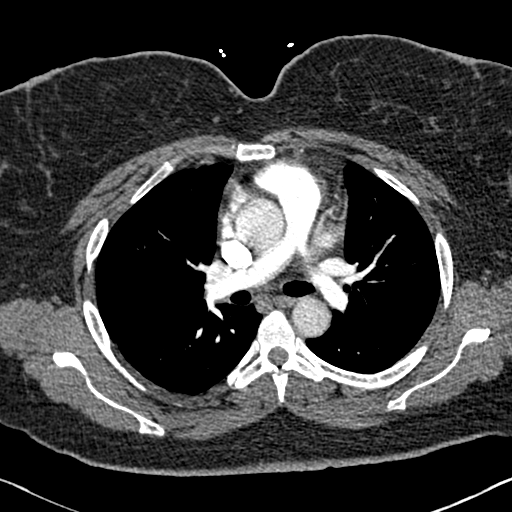
[im 157/241  lung]
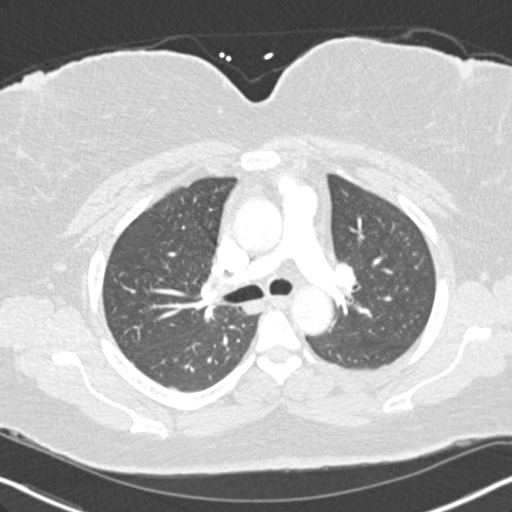
[im 167/241  soft-tissue]
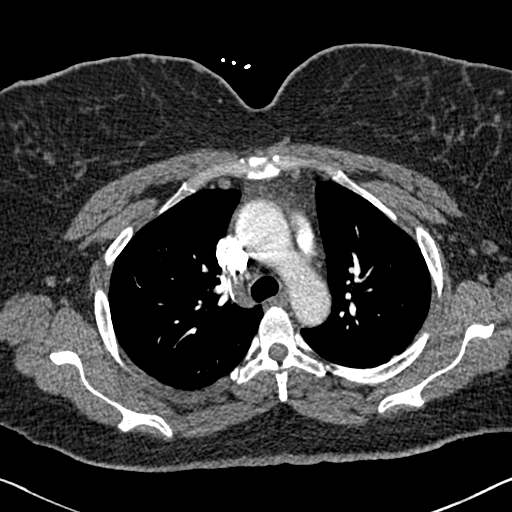
[im 188/241  lung]
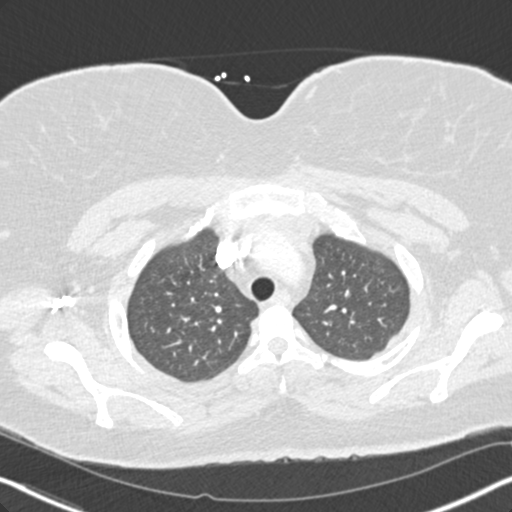
[im 199/241  soft-tissue]
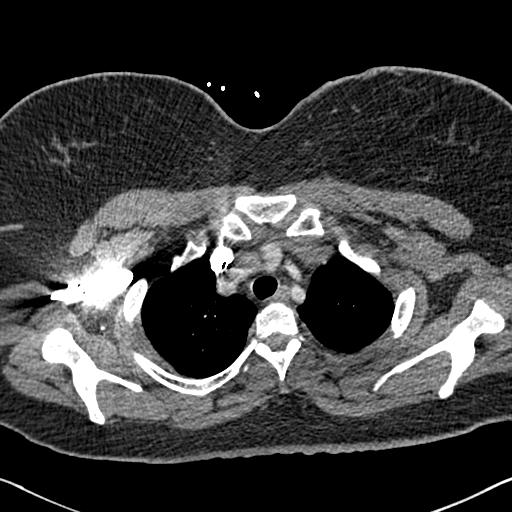
[im 209/241  lung]
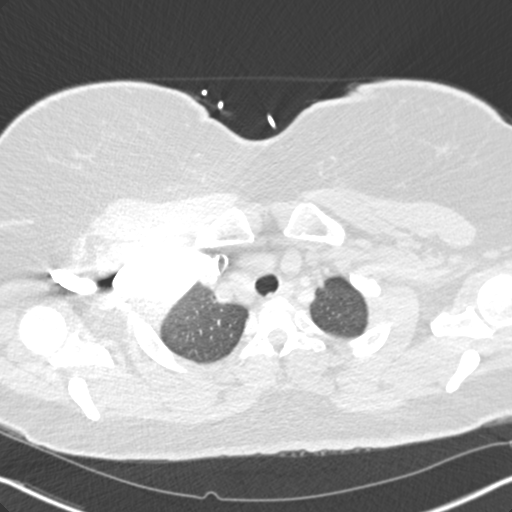
[im 230/241  soft-tissue]
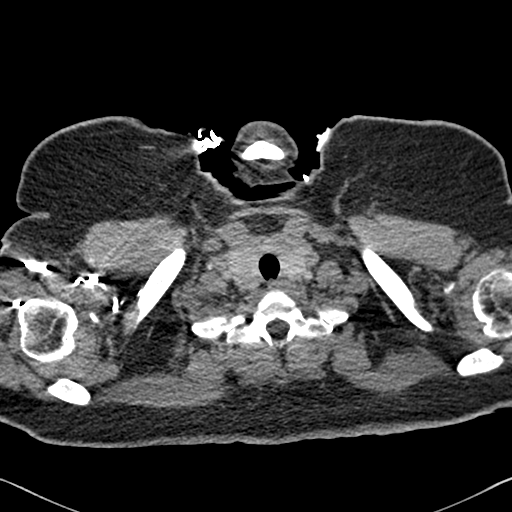

[Series 7: coronal mpr · coronal · 0.51mm/px · 2 of 91 slices shown]
[im 31/91  soft-tissue]
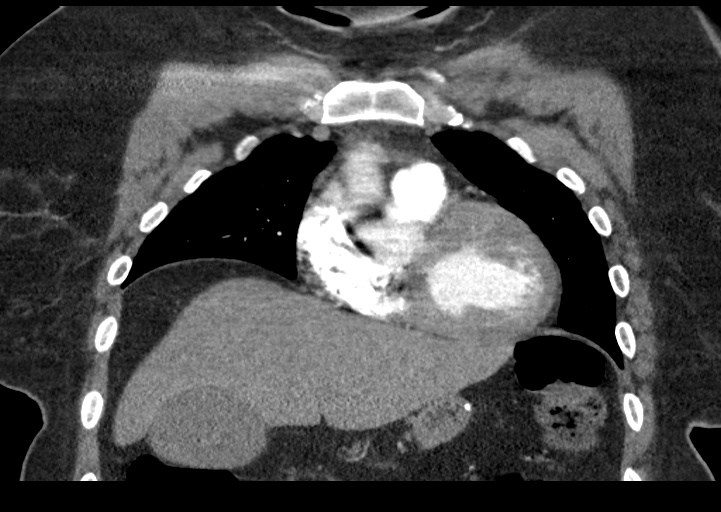
[im 61/91  soft-tissue]
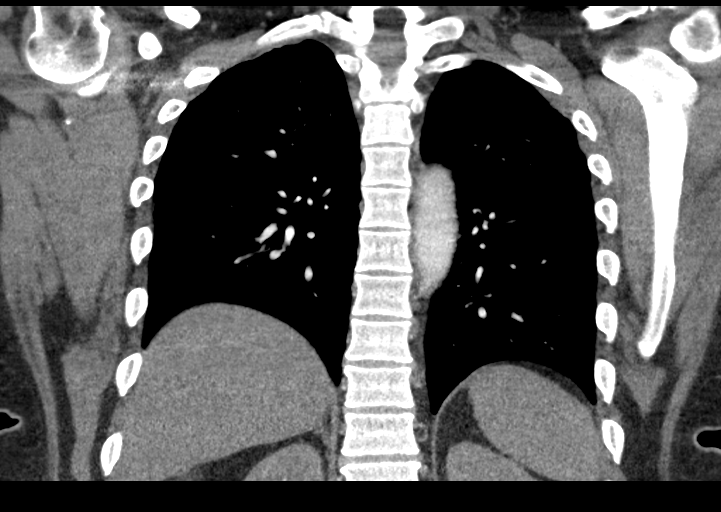

[18 of 46 positions shown; findings below may reference images not displayed]

FINDINGS: Cardiovascular: Satisfactory opacification of the pulmonary arteries
to the lobar level and some proximal segmental branches. No evidence
of acute pulmonary embolism. Normal heart size. No pericardial
effusion. Thoracic aorta is normal in caliber.

Mediastinum/Nodes: No enlarged lymph nodes. Visualized thyroid is
unremarkable. Esophagus is unremarkable.

Lungs/Pleura: No pleural effusion or pneumothorax. No consolidation.

Upper Abdomen: Sleeve gastrectomy. Increased density within the
gallbladder probably reflecting sludge. No acute abnormality.

Musculoskeletal: No chest wall abnormality. No acute or significant
osseous findings.

Review of the MIP images confirms the above findings.
IMPRESSION: No evidence of acute pulmonary embolism or other acute abnormality.

Probable sludge in the gallbladder.

## 2022-11-08 IMAGING — CR DG CHEST 2V
1 series · 2 of 2 positions shown · non-contrast
Comparison: 11/19/2018

CLINICAL DATA: Chest pain

EXAM:
CHEST - 2 VIEW

[Series 1: dg chest 2 view · 0.14mm/px · 2 of 2 slices shown]
[im 1/2]
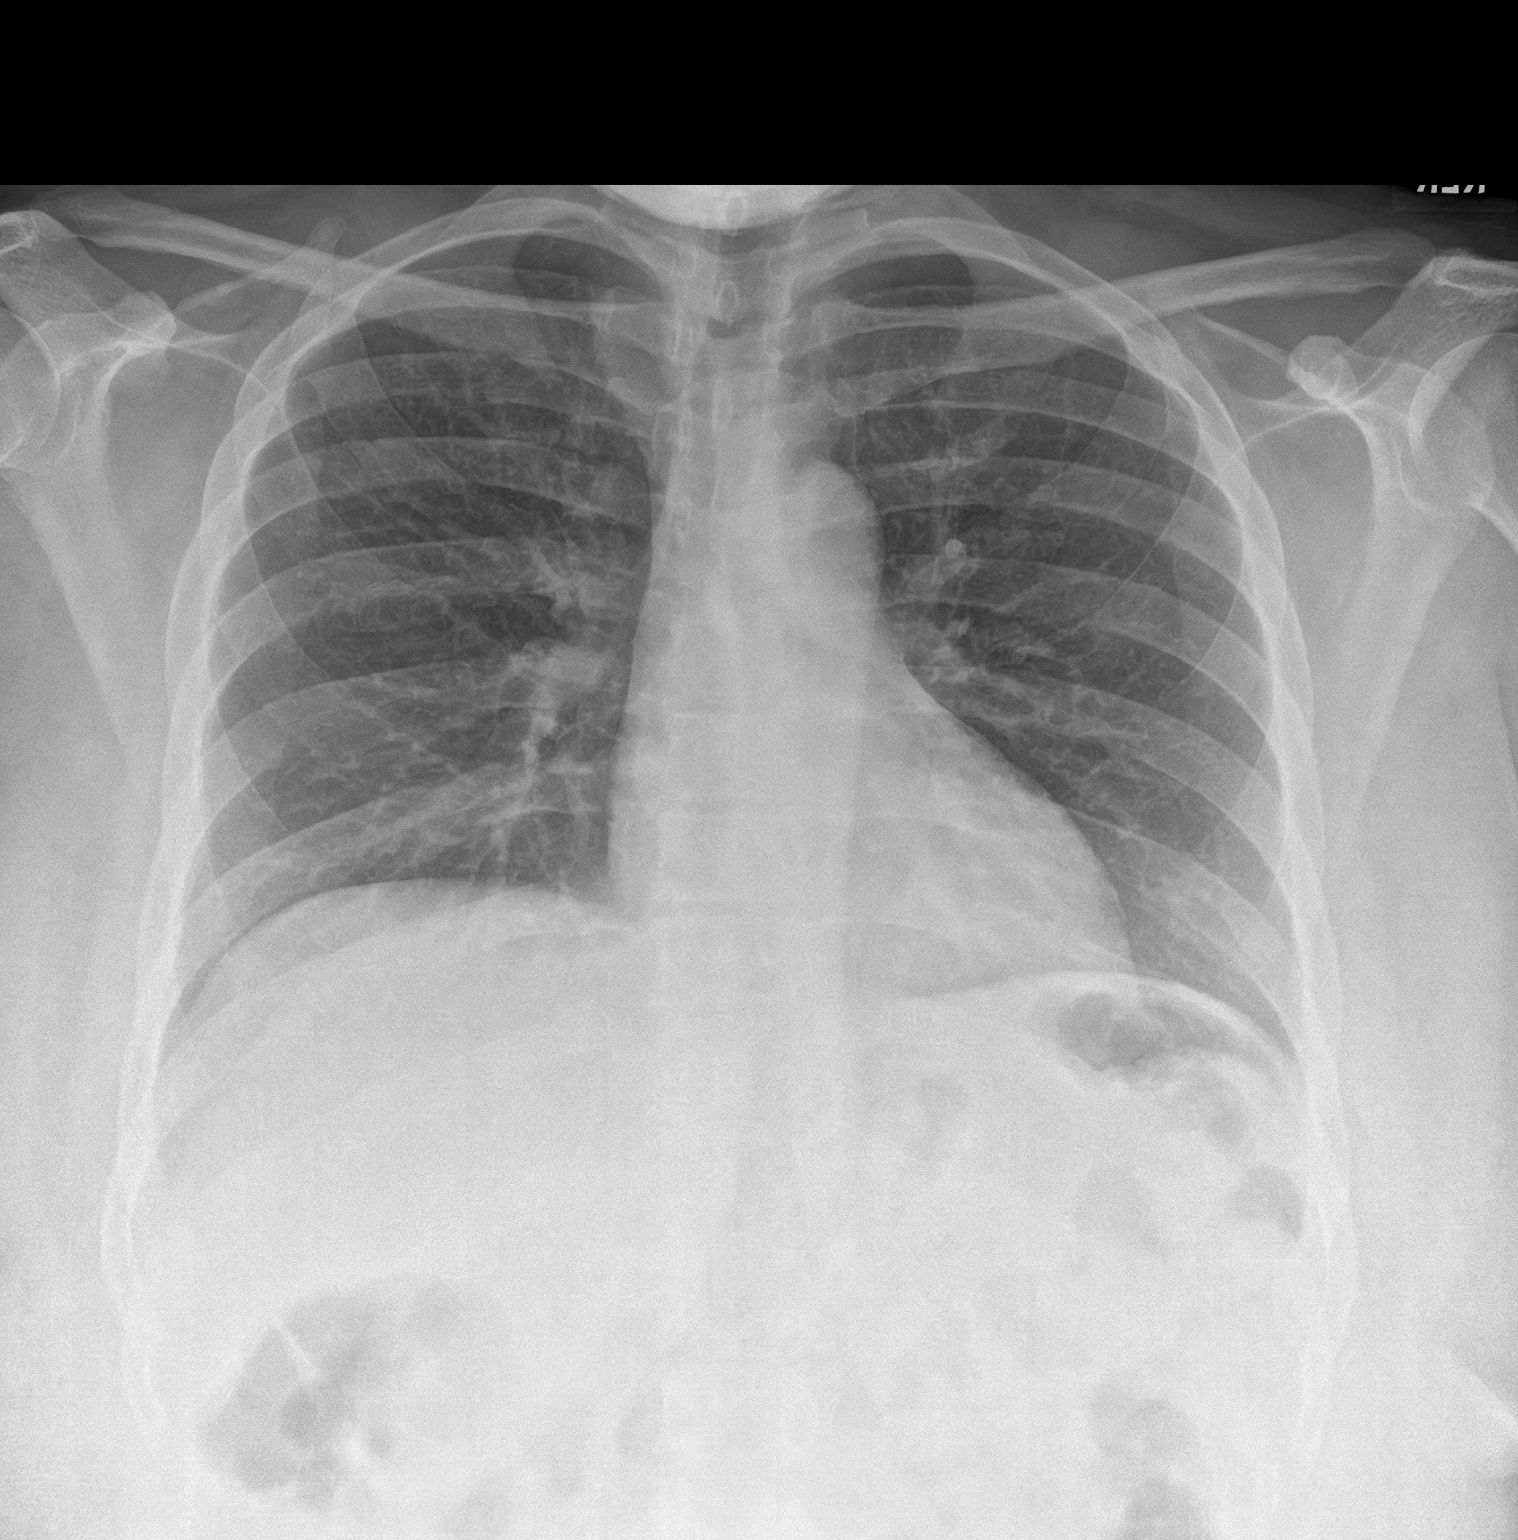
[im 2/2]
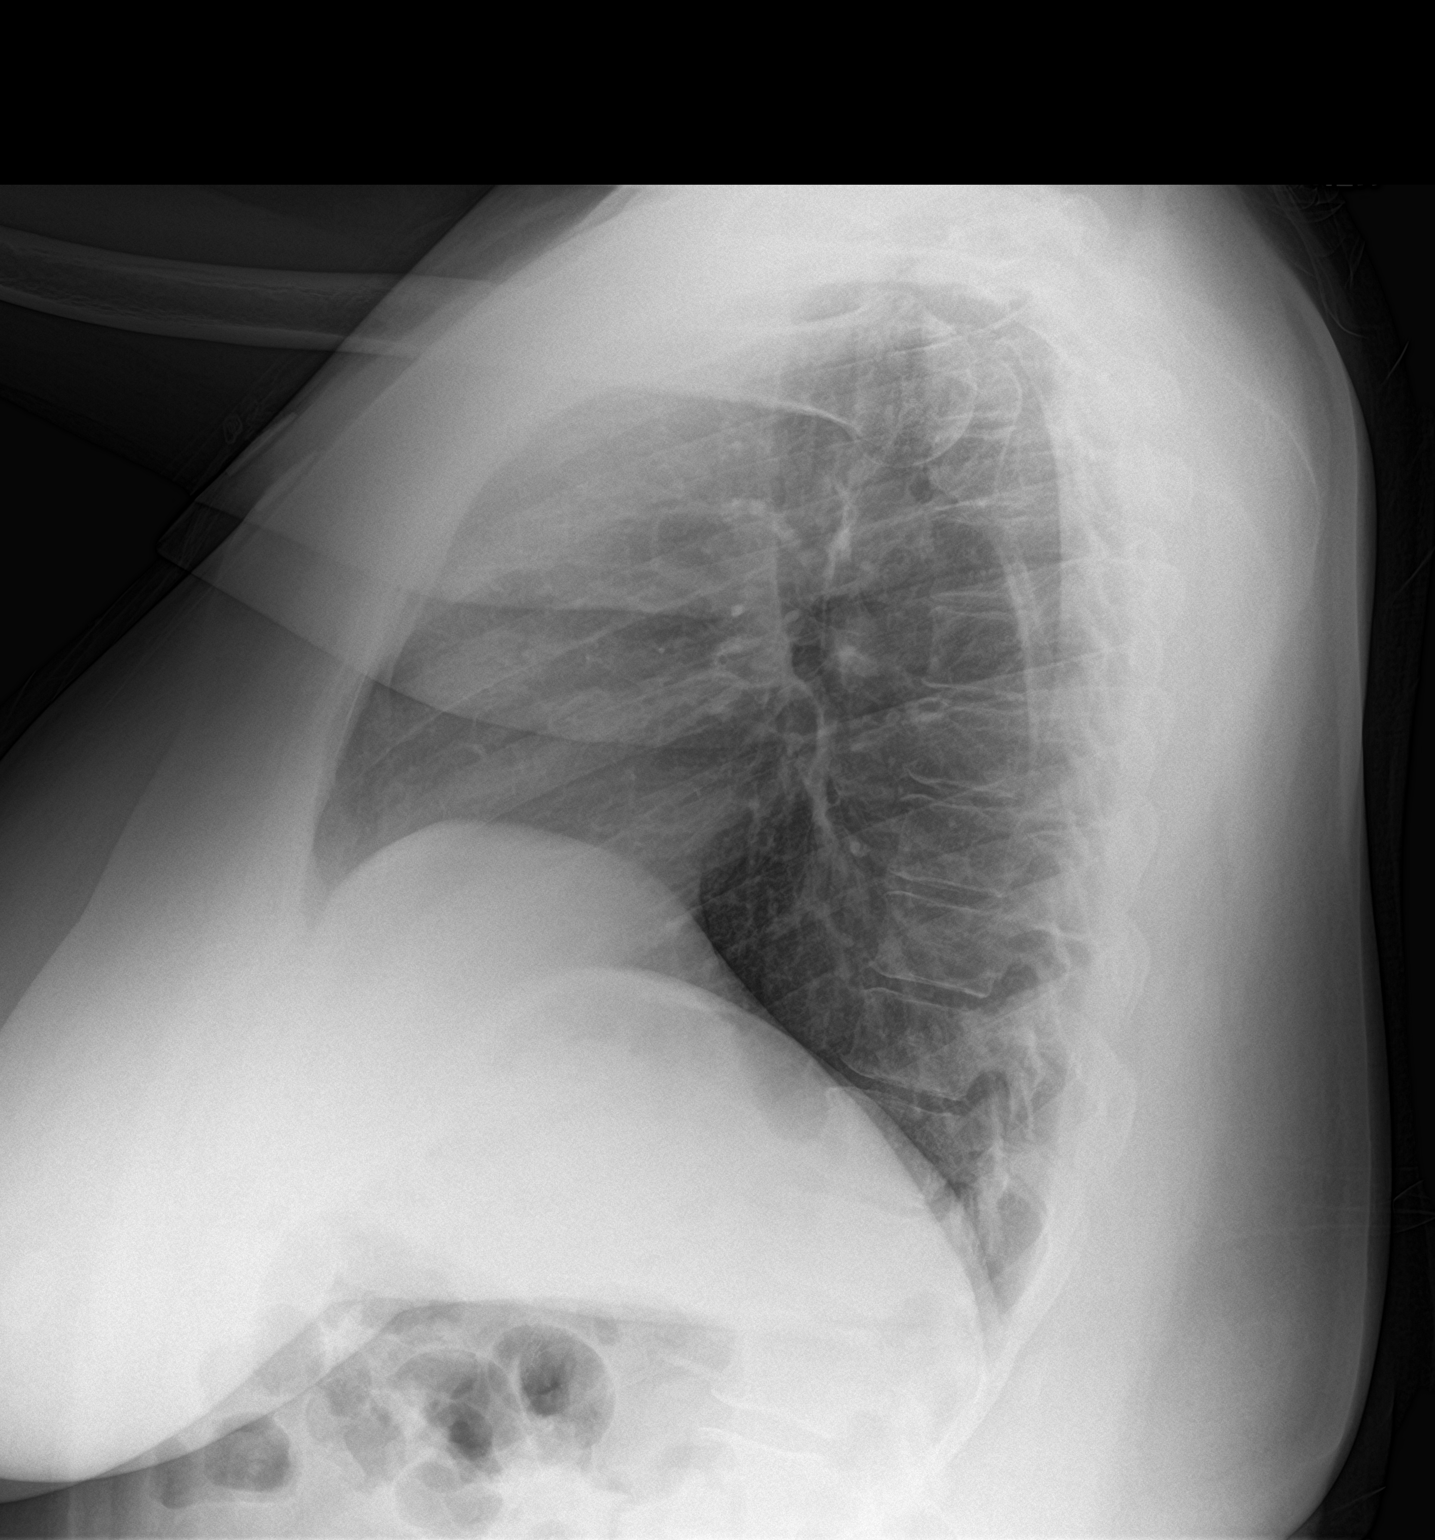

[2 of 2 positions shown; findings below may reference images not displayed]

FINDINGS: The heart size and mediastinal contours are within normal limits.
Low lung volumes. No consolidation or edema. No pleural effusion or
pneumothorax. The visualized skeletal structures are unremarkable.
IMPRESSION: No acute process in the chest.

## 2022-11-15 ENCOUNTER — Ambulatory Visit (INDEPENDENT_AMBULATORY_CARE_PROVIDER_SITE_OTHER): Payer: Managed Care, Other (non HMO) | Admitting: Plastic Surgery

## 2022-11-15 ENCOUNTER — Encounter: Payer: Self-pay | Admitting: Plastic Surgery

## 2022-11-15 VITALS — BP 132/80 | Resp 16 | Ht 70.0 in | Wt 153.0 lb

## 2022-11-15 DIAGNOSIS — R21 Rash and other nonspecific skin eruption: Secondary | ICD-10-CM | POA: Diagnosis not present

## 2022-11-15 DIAGNOSIS — M549 Dorsalgia, unspecified: Secondary | ICD-10-CM | POA: Diagnosis not present

## 2022-11-15 DIAGNOSIS — M793 Panniculitis, unspecified: Secondary | ICD-10-CM | POA: Diagnosis not present

## 2022-11-15 NOTE — Progress Notes (Signed)
Patient ID: Sydney Harvey, female    DOB: Jan 28, 1976, 47 y.o.   MRN: [id number]   Chief Complaint  Patient presents with   Advice Only    The patient is a lovely 47 year old female here for evaluation of her abdomen.  She is 5 feet 10 inches tall and weighs 153 pounds.  She was 297 pounds 2 years ago.  She underwent a duodenal switch and was able to lose the weight and has been very successful in keeping it off and keeping her weight steady over the past year.  She was prediabetic prior to the surgery and now is not diabetic and her hemoglobin A1c is within the normal range per the labs she brought in with her today from October 21, 2022.  She is not a smoker.  She does get rashes and skin breakdown she is having to use creams and lotions to keep from breaking out.  She is also had an abdominal hysterectomy and she has asthma.  She is otherwise in good health.  No sign of a hernia.    Review of Systems  Constitutional:  Negative for activity change and appetite change.  Eyes: Negative.   Respiratory: Negative.  Negative for chest tightness and shortness of breath.   Cardiovascular: Negative.   Gastrointestinal: Negative.   Endocrine: Negative.   Genitourinary: Negative.   Musculoskeletal:  Positive for back pain.  Skin:  Positive for rash.  Hematological: Negative.     Past Medical History:  Diagnosis Date   Asthma    Hypertension     Past Surgical History:  Procedure Laterality Date   ABDOMINAL HYSTERECTOMY        Current Outpatient Medications:    albuterol (PROVENTIL HFA;VENTOLIN HFA) 108 (90 Base) MCG/ACT inhaler, Inhale 2 puffs into the lungs every 4 (four) hours as needed for wheezing., Disp: , Rfl:    D3-50 1.25 MG (50000 UT) capsule, Take 50,000 Units by mouth once a week., Disp: , Rfl:    gabapentin (NEURONTIN) 100 MG capsule, Take 100 mg by mouth daily., Disp: , Rfl:    hydrochlorothiazide (HYDRODIURIL) 12.5 MG tablet, Take 12.5 mg by mouth daily., Disp: , Rfl:     losartan (COZAAR) 100 MG tablet, Take 100 mg by mouth daily., Disp: , Rfl:    meloxicam (MOBIC) 15 MG tablet, Take 15 mg by mouth daily., Disp: , Rfl:    Phendimetrazine Tartrate 105 MG CP24, Take 105 mg by mouth daily., Disp: , Rfl:    Objective:   Vitals:   11/15/22 1425  BP: 132/80  Resp: 16  SpO2: 99%    Physical Exam Vitals and nursing note reviewed.  Constitutional:      Appearance: Normal appearance.  HENT:     Head: Normocephalic and atraumatic.  Cardiovascular:     Rate and Rhythm: Normal rate.     Pulses: Normal pulses.  Pulmonary:     Effort: Pulmonary effort is normal.  Abdominal:     General: There is no distension.     Palpations: Abdomen is soft.     Tenderness: There is no abdominal tenderness.     Hernia: No hernia is present.  Musculoskeletal:        General: No swelling or deformity.  Skin:    General: Skin is warm.     Capillary Refill: Capillary refill takes less than 2 seconds.     Coloration: Skin is not jaundiced or pale.     Findings: No bruising or lesion.  Neurological:     Mental Status: She is alert and oriented to person, place, and time.  Psychiatric:        Mood and Affect: Mood normal.        Behavior: Behavior normal.        Thought Content: Thought content normal.        Judgment: Judgment normal.     Assessment & Plan:  Panniculitis  The patient is a good candidate for panniculectomy or abdominoplasty surgery.  I will provide her with a quote and we will also submit to insurance.  We discussed some of the risks and complications.  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  She had transferred almost a month.  Pictures were obtained of the patient and placed in the chart with the patient's or guardian's permission.   [place], DO

## 2022-11-17 ENCOUNTER — Ambulatory Visit: Payer: Managed Care, Other (non HMO) | Admitting: Family

## 2022-11-17 DIAGNOSIS — R0981 Nasal congestion: Secondary | ICD-10-CM

## 2022-11-17 DIAGNOSIS — Z20822 Contact with and (suspected) exposure to covid-19: Secondary | ICD-10-CM | POA: Diagnosis not present

## 2022-11-18 ENCOUNTER — Ambulatory Visit: Payer: Managed Care, Other (non HMO)

## 2022-11-22 ENCOUNTER — Encounter: Payer: Self-pay | Admitting: Plastic Surgery

## 2022-11-22 LAB — POCT XPERT XPRESS SARS COVID-2/FLU/RSV
FLU A: NEGATIVE
FLU B: NEGATIVE
RSV RNA, PCR: NEGATIVE
SARS Coronavirus 2: NEGATIVE

## 2022-11-24 ENCOUNTER — Encounter: Payer: Self-pay | Admitting: *Deleted

## 2022-11-28 NOTE — Progress Notes (Signed)
CHIEF COMPLAINT  COVID Testing      REASON FOR VISIT  COVID Test Only     ASSESSMENT  Contact with and (suspected) exposure to other viral communicable disease     PLAN  COVID/FluA+B/RSV completed in the office today  COVID Negative Flu A  Negative Flu B Negative RSV Negative

## 2022-12-09 ENCOUNTER — Telehealth: Payer: Self-pay | Admitting: Plastic Surgery

## 2022-12-09 NOTE — Telephone Encounter (Signed)
Pending Auth Ref# [id number] all clinicals faxed to [facility] [phone]

## 2022-12-13 ENCOUNTER — Telehealth: Payer: Self-pay | Admitting: Plastic Surgery

## 2022-12-13 NOTE — Telephone Encounter (Signed)
Spoke with pt to inform that insurance denied panniculectomy coverage.  She stated she also received notification by email from [place].

## 2022-12-14 ENCOUNTER — Institutional Professional Consult (permissible substitution): Payer: Managed Care, Other (non HMO) | Admitting: Plastic Surgery

## 2023-01-25 ENCOUNTER — Inpatient Hospital Stay: Admission: RE | Admit: 2023-01-25 | Payer: Medicaid Other | Source: Ambulatory Visit

## 2023-02-07 ENCOUNTER — Encounter: Payer: Self-pay | Admitting: Plastic Surgery

## 2023-02-07 ENCOUNTER — Telehealth: Payer: Self-pay

## 2023-02-07 NOTE — Telephone Encounter (Signed)
Patient inquired as to why cpt code 16109 was denied. In denial letter is states panniculectomy is not a covered surgery under her plan. Patient had chatted with a CSR through the patient portal at [facility]. They advised CPT code 60454 (abdominoplasty) and 15877 liposuction were covered based on if they were medical necessary. Spoke with Rolm Bookbinder reference# [id number] confirmed [id number] and [id number] are not covered, but [phone] is covered, but not if it is cosmetically related. Spoke with [place]. 3:04 pn 02/07/23 for [place] [facility]#[id number] for cpt codes# 96295 and 15877. Notified patient via MyChart.

## 2023-02-18 ENCOUNTER — Encounter: Payer: Self-pay | Admitting: Plastic Surgery

## 2023-02-21 ENCOUNTER — Telehealth: Payer: Self-pay

## 2023-02-21 NOTE — Telephone Encounter (Signed)
Received denial due to upper abdominal plasty and liposuction I not covered under policy.  Discussed with patient.

## 2023-02-22 ENCOUNTER — Telehealth: Payer: Self-pay | Admitting: *Deleted

## 2023-02-22 NOTE — Telephone Encounter (Signed)
Spoke with patient about the back and forth over the denials for 16109, 15347, [phone]  Per [employer] all codes are an excluded benefit. The denial letters do not give information for appeal or peer to peer because the benefits are excluded.   Offered to conference call with Rosann Auerbach but she currently declined.

## 2023-07-18 ENCOUNTER — Telehealth: Payer: Self-pay | Admitting: *Deleted

## 2023-07-18 NOTE — Telephone Encounter (Signed)
Error

## 2023-09-06 ENCOUNTER — Ambulatory Visit: Payer: Managed Care, Other (non HMO) | Admitting: Internal Medicine

## 2023-09-16 ENCOUNTER — Encounter: Payer: Self-pay | Admitting: Internal Medicine

## 2023-09-16 ENCOUNTER — Ambulatory Visit (INDEPENDENT_AMBULATORY_CARE_PROVIDER_SITE_OTHER): Payer: Managed Care, Other (non HMO) | Admitting: Internal Medicine

## 2023-09-16 VITALS — BP 107/76 | HR 70 | Ht 70.0 in | Wt 149.6 lb

## 2023-09-16 DIAGNOSIS — Z202 Contact with and (suspected) exposure to infections with a predominantly sexual mode of transmission: Secondary | ICD-10-CM | POA: Diagnosis not present

## 2023-09-16 NOTE — Progress Notes (Signed)
Patient concerned about STI exposure. Swab collected.

## 2023-09-17 ENCOUNTER — Other Ambulatory Visit: Payer: Self-pay | Admitting: Internal Medicine

## 2023-09-19 MED ORDER — ALBUTEROL SULFATE HFA 108 (90 BASE) MCG/ACT IN AERS: 2.0000 | INHALATION_SPRAY | RESPIRATORY_TRACT | 2 refills | Status: AC | PRN

## 2023-09-20 LAB — NUSWAB VG+, HSV
BVAB 2: HIGH {score} — AB
Candida albicans, NAA: POSITIVE — AB
Candida glabrata, NAA: NEGATIVE
Chlamydia trachomatis, NAA: NEGATIVE
HSV 1 NAA: NEGATIVE
HSV 2 NAA: NEGATIVE
Megasphaera 1: HIGH {score} — AB
Neisseria gonorrhoeae, NAA: NEGATIVE
Trich vag by NAA: NEGATIVE

## 2023-09-22 ENCOUNTER — Other Ambulatory Visit: Payer: Self-pay | Admitting: Internal Medicine

## 2023-09-22 DIAGNOSIS — N760 Acute vaginitis: Secondary | ICD-10-CM

## 2023-09-22 DIAGNOSIS — B3731 Acute candidiasis of vulva and vagina: Secondary | ICD-10-CM

## 2023-09-22 MED ORDER — METRONIDAZOLE 0.75 % VA GEL: 1.0000 | Freq: Two times a day (BID) | VAGINAL | 0 refills | Status: AC

## 2023-09-22 MED ORDER — FLUCONAZOLE 150 MG PO TABS: 150.0000 mg | ORAL_TABLET | Freq: Every day | ORAL | 0 refills | Status: DC

## 2023-10-31 ENCOUNTER — Other Ambulatory Visit: Payer: Self-pay | Admitting: Internal Medicine

## 2023-10-31 ENCOUNTER — Ambulatory Visit (INDEPENDENT_AMBULATORY_CARE_PROVIDER_SITE_OTHER): Payer: Managed Care, Other (non HMO) | Admitting: Internal Medicine

## 2023-10-31 ENCOUNTER — Encounter: Payer: Self-pay | Admitting: Internal Medicine

## 2023-10-31 DIAGNOSIS — J069 Acute upper respiratory infection, unspecified: Secondary | ICD-10-CM | POA: Diagnosis not present

## 2023-10-31 DIAGNOSIS — R6889 Other general symptoms and signs: Secondary | ICD-10-CM | POA: Diagnosis not present

## 2023-10-31 DIAGNOSIS — J101 Influenza due to other identified influenza virus with other respiratory manifestations: Secondary | ICD-10-CM

## 2023-10-31 LAB — POCT XPERT XPRESS SARS COVID-2/FLU/RSV
FLU A: POSITIVE
FLU B: NEGATIVE
RSV RNA, PCR: NEGATIVE
SARS Coronavirus 2: NEGATIVE

## 2023-10-31 MED ORDER — OSELTAMIVIR PHOSPHATE 75 MG PO CAPS
75.0000 mg | ORAL_CAPSULE | Freq: Two times a day (BID) | ORAL | 0 refills | Status: AC
Start: 2023-10-31 — End: 2023-11-05

## 2023-10-31 NOTE — Progress Notes (Signed)
 Patient notified

## 2023-10-31 NOTE — Progress Notes (Signed)
 Flu A Positive-  Rx Tamiflu  sent.

## 2023-11-10 ENCOUNTER — Other Ambulatory Visit: Payer: Self-pay | Admitting: Internal Medicine

## 2023-11-10 DIAGNOSIS — N760 Acute vaginitis: Secondary | ICD-10-CM

## 2023-11-10 DIAGNOSIS — B9689 Other specified bacterial agents as the cause of diseases classified elsewhere: Secondary | ICD-10-CM

## 2024-01-23 ENCOUNTER — Telehealth: Payer: Self-pay | Admitting: Internal Medicine

## 2024-01-25 ENCOUNTER — Other Ambulatory Visit: Payer: Self-pay | Admitting: Internal Medicine

## 2024-01-25 DIAGNOSIS — B3731 Acute candidiasis of vulva and vagina: Secondary | ICD-10-CM

## 2024-03-29 NOTE — Telephone Encounter (Signed)
 Entered in error

## 2024-09-03 ENCOUNTER — Ambulatory Visit: Admitting: Internal Medicine
